# Patient Record
Sex: Male | Born: 1995 | Race: White | Hispanic: No | Marital: Single | Smoking: Never smoker
Health system: Southern US, Community
[De-identification: ages and names within clinical notes are randomized; demographics above are authoritative.]

## PROBLEM LIST (undated history)

## (undated) DIAGNOSIS — K219 Gastro-esophageal reflux disease without esophagitis: Secondary | ICD-10-CM

## (undated) HISTORY — PX: TONSILLECTOMY: SUR1361

## (undated) HISTORY — PX: ADENOIDECTOMY: SUR15

---
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
## 2016-06-16 MED FILL — HYDROCHLOROTHIAZIDE 25 MG T: 25 | 30 days supply | Qty: 30 | Fill #0

## 2016-07-19 MED FILL — HYDROCHLOROTHIAZIDE 25 MG T: 25 | 30 days supply | Qty: 30 | Fill #1

## 2016-08-18 MED FILL — HYDROCHLOROTHIAZIDE 25 MG T: 25 | 30 days supply | Qty: 30 | Fill #2

## 2016-09-22 MED FILL — HYDROCHLOROTHIAZIDE 25 MG T: 25 | 30 days supply | Qty: 30 | Fill #0

## 2016-11-11 MED FILL — HYDROCHLOROTHIAZIDE 25 MG T: 25 | 30 days supply | Qty: 30 | Fill #1
